# Patient Record
Sex: Male | Born: 1970
Health system: Southern US, Community
[De-identification: ages and names within clinical notes are randomized; demographics above are authoritative.]

## PROBLEM LIST (undated history)

## (undated) DIAGNOSIS — T7840XA Allergy, unspecified, initial encounter: Secondary | ICD-10-CM

## (undated) DIAGNOSIS — Z86010 Personal history of colonic polyps: Secondary | ICD-10-CM

## (undated) DIAGNOSIS — K219 Gastro-esophageal reflux disease without esophagitis: Secondary | ICD-10-CM

## (undated) DIAGNOSIS — K635 Polyp of colon: Secondary | ICD-10-CM

## (undated) HISTORY — DX: Polyp of colon: K63.5

## (undated) HISTORY — PX: UVULOPALATOPHARYNGOPLASTY, TONSILLECTOMY AND SEPTOPLASTY: SHX2632

## (undated) HISTORY — PX: ESOPHAGOGASTRODUODENOSCOPY: SHX1529

## (undated) HISTORY — PX: KNEE SURGERY: SHX244

## (undated) HISTORY — DX: Gastro-esophageal reflux disease without esophagitis: K21.9

## (undated) HISTORY — DX: Personal history of colonic polyps: Z86.010

## (undated) HISTORY — DX: Allergy, unspecified, initial encounter: T78.40XA

## (undated) HISTORY — PX: COLONOSCOPY: SHX174

## (undated) HISTORY — PX: APPENDECTOMY: SHX54

---

## 2016-06-01 ENCOUNTER — Emergency Department (HOSPITAL_COMMUNITY): Payer: Managed Care, Other (non HMO)

## 2016-06-01 ENCOUNTER — Encounter (HOSPITAL_COMMUNITY): Payer: Self-pay | Admitting: *Deleted

## 2016-06-01 ENCOUNTER — Other Ambulatory Visit: Payer: Self-pay

## 2016-06-01 ENCOUNTER — Emergency Department (HOSPITAL_COMMUNITY)
Admission: EM | Admit: 2016-06-01 | Discharge: 2016-06-01 | Disposition: A | Discharge status: Home / Self Care | Payer: Managed Care, Other (non HMO) | Attending: Emergency Medicine | Admitting: Emergency Medicine

## 2016-06-01 ENCOUNTER — Encounter: Payer: Self-pay | Admitting: Gastroenterology

## 2016-06-01 DIAGNOSIS — K209 Esophagitis, unspecified without bleeding: Secondary | ICD-10-CM

## 2016-06-01 DIAGNOSIS — R0789 Other chest pain: Secondary | ICD-10-CM | POA: Diagnosis not present

## 2016-06-01 DIAGNOSIS — R079 Chest pain, unspecified: Secondary | ICD-10-CM | POA: Diagnosis present

## 2016-06-01 LAB — BASIC METABOLIC PANEL
Anion gap: 9 (ref 5–15)
BUN: 7 mg/dL (ref 6–20)
CALCIUM: 10.1 mg/dL (ref 8.9–10.3)
CO2: 28 mmol/L (ref 22–32)
Chloride: 102 mmol/L (ref 101–111)
Creatinine, Ser: 0.91 mg/dL (ref 0.61–1.24)
GFR calc Af Amer: >60 mL/min (ref >60)
GLUCOSE: 97 mg/dL (ref 65–99)
Potassium: 3.8 mmol/L (ref 3.5–5.1)
Sodium: 139 mmol/L (ref 135–145)

## 2016-06-01 LAB — I-STAT TROPONIN, ED
TROPONIN I, POC: 0 ng/mL (ref 0.00–0.08)
TROPONIN I, POC: 0 ng/mL (ref 0.00–0.08)

## 2016-06-01 LAB — CBC
HEMATOCRIT: 48.3 % (ref 39.0–52.0)
HEMOGLOBIN: 16.8 g/dL (ref 13.0–17.0)
MCH: 30.7 pg (ref 26.0–34.0)
MCHC: 34.8 g/dL (ref 30.0–36.0)
MCV: 88.1 fL (ref 78.0–100.0)
Platelets: 283 10*3/uL (ref 150–400)
RBC: 5.48 MIL/uL (ref 4.22–5.81)
RDW: 12.4 % (ref 11.5–15.5)
WBC: 11.5 10*3/uL — ABNORMAL HIGH (ref 4.0–10.5)

## 2016-06-01 LAB — HEPATIC FUNCTION PANEL
ALBUMIN: 5.1 g/dL — AB (ref 3.5–5.0)
ALT: 26 U/L (ref 17–63)
AST: 30 U/L (ref 15–41)
Alkaline Phosphatase: 72 U/L (ref 38–126)
Bilirubin, Direct: 0.3 mg/dL (ref 0.1–0.5)
Indirect Bilirubin: 1 mg/dL — ABNORMAL HIGH (ref 0.3–0.9)
Total Bilirubin: 1.3 mg/dL — ABNORMAL HIGH (ref 0.3–1.2)
Total Protein: 8.2 g/dL — ABNORMAL HIGH (ref 6.5–8.1)

## 2016-06-01 LAB — LIPASE, BLOOD: Lipase: 21 U/L (ref 11–51)

## 2016-06-01 MED ORDER — IOPAMIDOL (ISOVUE-370) INJECTION 76%
INTRAVENOUS | Status: AC
  Administered 2016-06-01: 100 mL via INTRAVENOUS
  Filled 2016-06-01: qty 100

## 2016-06-01 MED ORDER — SODIUM CHLORIDE 0.9 % IV BOLUS (SEPSIS)
1000.0000 mL | Freq: Once | INTRAVENOUS | Status: AC
  Administered 2016-06-01: 1000 mL via INTRAVENOUS

## 2016-06-01 MED ORDER — PANTOPRAZOLE SODIUM 40 MG PO TBEC: 40.0000 mg | DELAYED_RELEASE_TABLET | Freq: Every day | ORAL | Status: DC

## 2016-06-01 MED ORDER — MORPHINE SULFATE (PF) 4 MG/ML IV SOLN
4.0000 mg | Freq: Once | INTRAVENOUS | Status: AC
  Administered 2016-06-01: 4 mg via INTRAVENOUS
  Filled 2016-06-01: qty 1

## 2016-06-01 MED ORDER — GI COCKTAIL ~~LOC~~
30.0000 mL | Freq: Once | ORAL | Status: AC
  Administered 2016-06-01: 30 mL via ORAL
  Filled 2016-06-01: qty 30

## 2016-06-01 MED ORDER — SUCRALFATE 1 GM/10ML PO SUSP
1.0000 g | Freq: Three times a day (TID) | ORAL | Status: DC | PRN
Start: 2016-06-01 — End: 2016-06-14

## 2016-06-01 MED ORDER — OXYCODONE-ACETAMINOPHEN 5-325 MG PO TABS: 1.0000 | ORAL_TABLET | Freq: Four times a day (QID) | ORAL | Status: DC | PRN

## 2016-06-01 MED ORDER — ASPIRIN 81 MG PO CHEW
324.0000 mg | CHEWABLE_TABLET | Freq: Once | ORAL | Status: AC
  Administered 2016-06-01: 324 mg via ORAL
  Filled 2016-06-01: qty 4

## 2016-06-01 MED ORDER — ONDANSETRON HCL 4 MG/2ML IJ SOLN
4.0000 mg | Freq: Once | INTRAMUSCULAR | Status: DC
  Filled 2016-06-01: qty 2

## 2016-06-01 MED ORDER — NITROGLYCERIN 0.4 MG SL SUBL: 0.4000 mg | SUBLINGUAL_TABLET | SUBLINGUAL | Status: DC | PRN

## 2016-06-01 MED ORDER — OXYCODONE-ACETAMINOPHEN 5-325 MG PO TABS
2.0000 | ORAL_TABLET | Freq: Once | ORAL | Status: AC
  Administered 2016-06-01: 2 via ORAL
  Filled 2016-06-01: qty 2

## 2016-06-01 MED ORDER — PANTOPRAZOLE SODIUM 40 MG PO TBEC
40.0000 mg | DELAYED_RELEASE_TABLET | Freq: Every day | ORAL | Status: DC
  Administered 2016-06-01: 40 mg via ORAL
  Filled 2016-06-01: qty 1

## 2016-06-01 NOTE — ED Notes (Signed)
Pt reports onset this am of pain in mid chest and into his neck and shoulder. Pain increases when breathing. Denies recent cough. ekg done at triage.

## 2016-06-01 NOTE — Discharge Instructions (Signed)
Call and make an appointment to follow-up with the gastroenterologist. Return immediately for worsening pain or any concern. You may take over-the-counter Mylanta or Maalox. Avoid NSAIDs including ibuprofen and naproxen. Avoid spicy and acidic foods.  Esophagitis Esophagitis is inflammation of the esophagus. The esophagus is the tube that carries food and liquids from your mouth to your stomach. Esophagitis can cause soreness or pain in the esophagus. This condition can make it difficult and painful to swallow.  CAUSES Most causes of esophagitis are not serious. Common causes of this condition include:  Gastroesophageal reflux disease (GERD). This is when stomach contents move back up into the esophagus (reflux).  Repeated vomiting.  An allergic-type reaction, especially caused by food allergies (eosinophilic esophagitis).  Injury to the esophagus by swallowing large pills with or without water, or swallowing certain types of medicines.  Swallowing (ingesting) harmful chemicals, such as household cleaning products.  Heavy alcohol use.  An infection of the esophagus.This most often occurs in people who have a weakened immune system.  Radiation or chemotherapy treatment for cancer.  Certain diseases such as sarcoidosis, Crohn disease, and scleroderma. SYMPTOMS Symptoms of this condition include:  Difficult or painful swallowing.  Pain with swallowing acidic liquids, such as citrus juices.  Pain with burping.  Chest pain.  Difficulty breathing.  Nausea.  Vomiting.  Pain in the abdomen.  Weight loss.  Ulcers in the mouth.  Patches of white material in the mouth (candidiasis).  Fever.  Coughing up blood or vomiting blood.  Stool that is black, tarry, or bright red. DIAGNOSIS Your health care provider will take a medical history and perform a physical exam. You may also have other tests, including:  An endoscopy to examine your stomach and esophagus with a small  camera.  A test that measures the acidity level in your esophagus.  A test that measures how much pressure is on your esophagus.  A barium swallow or modified barium swallow to show the shape, size, and functioning of your esophagus.  Allergy tests. TREATMENT Treatment for this condition depends on the cause of your esophagitis. In some cases, steroids or other medicines may be given to help relieve your symptoms or to treat the underlying cause of your condition. You may have to make some lifestyle changes, such as:  Avoiding alcohol.  Quitting smoking.  Changing your diet.  Exercising.  Changing your sleep habits and your sleep environment. HOME CARE INSTRUCTIONS Take these actions to decrease your discomfort and to help avoid complications. Diet  Follow a diet as recommended by your health care provider. This may involve avoiding foods and drinks such as:  Coffee and tea (with or without caffeine).  Drinks that contain alcohol.  Energy drinks and sports drinks.  Carbonated drinks or sodas.  Chocolate and cocoa.  Peppermint and mint flavorings.  Garlic and onions.  Horseradish.  Spicy and acidic foods, including peppers, chili powder, curry powder, vinegar, hot sauces, and barbecue sauce.  Citrus fruit juices and citrus fruits, such as oranges, lemons, and limes.  Tomato-based foods, such as red sauce, chili, salsa, and pizza with red sauce.  Fried and fatty foods, such as donuts, french fries, potato chips, and high-fat dressings.  High-fat meats, such as hot dogs and fatty cuts of red and white meats, such as rib eye steak, sausage, ham, and bacon.  High-fat dairy items, such as whole milk, butter, and cream cheese.  Eat small, frequent meals instead of large meals.  Avoid drinking large amounts of liquid  with your meals.  Avoid eating meals during the 2-3 hours before bedtime.  Avoid lying down right after you eat.  Do not exercise right after you  eat.  Avoid foods and drinks that seem to make your symptoms worse. General Instructions  Pay attention to any changes in your symptoms.  Take over-the-counter and prescription medicines only as told by your health care provider. Do not take aspirin, ibuprofen, or other NSAIDs unless your health care provider told you to do so.  If you have trouble taking pills, use a pill splitter to decrease the size of the pill. This will decrease the chance of the pill getting stuck or injuring your esophagus on the way down. Also, drink water after you take a pill.  Do not use any tobacco products, including cigarettes, chewing tobacco, and e-cigarettes. If you need help quitting, ask your health care provider.  Wear loose-fitting clothing. Do not wear anything tight around your waist that causes pressure on your abdomen.  Raise (elevate) the head of your bed about 6 inches (15 cm).  Try to reduce your stress, such as with yoga or meditation. If you need help reducing stress, ask your health care provider.  If you are overweight, reduce your weight to an amount that is healthy for you. Ask your health care provider for guidance about a safe weight loss goal.  Keep all follow-up visits as told by your health care provider. This is important. SEEK MEDICAL CARE IF:  You have new symptoms.  You have unexplained weight loss.  You have difficulty swallowing, or it hurts to swallow.  You have wheezing or a persistent cough.  Your symptoms do not improve with treatment.  You have frequent heartburn for more than two weeks. SEEK IMMEDIATE MEDICAL CARE IF:  You have severe pain in your arms, neck, jaw, teeth, or back.  You feel sweaty, dizzy, or light-headed.  You have chest pain or shortness of breath.  You vomit and your vomit looks like blood or coffee grounds.  Your stool is bloody or black.  You have a fever.  You cannot swallow, drink, or eat.   This information is not intended to  replace advice given to you by your health care provider. Make sure you discuss any questions you have with your health care provider.   Document Released: 01/03/2005 Document Revised: 08/17/2015 Document Reviewed: 03/23/2015 Elsevier Interactive Patient Education [employer].

## 2016-06-01 NOTE — ED Provider Notes (Signed)
CSN: [id number]     Arrival date & time 06/01/16  1011 History   First MD Initiated Contact with Patient 06/01/16 1026     Chief Complaint  Patient presents with  . Chest Pain     (Consider location/radiation/quality/duration/timing/severity/associated sxs/prior Treatment) HPI Patient presents with chest pain upon waking this morning. Describes it as a sharp pain that radiates to his neck. Worse with deep breathing or swallowing. No recent cough. No shortness of breath. No fever or chills. Patient states he has a history of gastric esophageal reflux disease and stopped taking his Zantac because the symptoms had improved. Ate lasagna last night. No smoking history. No personal history for coronary artery disease or from embolic disease. He does have 2 brothers that have had blood clots in the past associated with surgery. No melena.  History reviewed. No pertinent past medical history. History reviewed. No pertinent past surgical history. History reviewed. No pertinent family history. Social History  Substance Use Topics  . Smoking status: Never Smoker   . Smokeless tobacco: None  . Alcohol Use: Yes    Review of Systems  Constitutional: Negative for fever and chills.  Respiratory: Negative for cough and shortness of breath.   Cardiovascular: Positive for chest pain. Negative for palpitations and leg swelling.  Gastrointestinal: Positive for abdominal pain. Negative for nausea, vomiting, diarrhea and constipation.  Musculoskeletal: Negative for myalgias, back pain, neck pain and neck stiffness.  Skin: Negative for rash and wound.  Neurological: Negative for dizziness, weakness, light-headedness, numbness and headaches.  All other systems reviewed and are negative.     Allergies  Review of patient's allergies indicates no known allergies.  Home Medications   Prior to Admission medications   Medication Sig Start Date End Date Taking? Authorizing Provider  cetirizine (ZYRTEC) 10  MG tablet Take 10 mg by mouth daily.   Yes Historical Provider, MD  fluticasone (FLONASE) 50 MCG/ACT nasal spray Place 1 spray into both nostrils daily.   Yes Historical Provider, MD  ranitidine (ZANTAC) 150 MG tablet Take 150 mg by mouth daily as needed for heartburn.   Yes Historical Provider, MD  oxyCODONE-acetaminophen (PERCOCET/ROXICET) 5-325 MG tablet Take 1-2 tablets by mouth every 6 (six) hours as needed for severe pain. 06/01/16   Julianne Rice, MD  pantoprazole (PROTONIX) 40 MG tablet Take 1 tablet (40 mg total) by mouth daily. 06/01/16   Julianne Rice, MD  sucralfate (CARAFATE) 1 GM/10ML suspension Take 10 mLs (1 g total) by mouth 3 (three) times daily with meals as needed (abdominal/chest pain). 06/01/16   Julianne Rice, MD   BP 128/89 mmHg  Pulse 92  Temp(Src) 98 F (36.7 C) (Oral)  Resp 28  SpO2 100% Physical Exam  Constitutional: He is oriented to person, place, and time. He appears well-developed and well-nourished. No distress.  HENT:  Head: Normocephalic and atraumatic.  Mouth/Throat: Oropharynx is clear and moist.  Eyes: EOM are normal. Pupils are equal, round, and reactive to light.  Neck: Normal range of motion. Neck supple. No JVD present.  Cardiovascular: Normal rate and regular rhythm.  Exam reveals no gallop and no friction rub.   No murmur heard. Pulmonary/Chest: Effort normal and breath sounds normal. No respiratory distress. He has no wheezes. He has no rales. He exhibits no tenderness.  Abdominal: Soft. Bowel sounds are normal. He exhibits no distension and no mass. There is no tenderness. There is no rebound and no guarding.  Musculoskeletal: Normal range of motion. He exhibits no edema or tenderness.  No lower extremity asymmetry or tenderness. Distal pulses are equal.  Neurological: He is alert and oriented to person, place, and time.  Moves all extremities without deficit. Sensation is fully intact.  Skin: Skin is warm and dry. No rash noted. No  erythema.  Psychiatric: He has a normal mood and affect. His behavior is normal.  Nursing note and vitals reviewed.   ED Course  Procedures (including critical care time) Labs Review Labs Reviewed  CBC - Abnormal; Notable for the following:    WBC 11.5 (*)    All other components within normal limits  HEPATIC FUNCTION PANEL - Abnormal; Notable for the following:    Total Protein 8.2 (*)    Albumin 5.1 (*)    Total Bilirubin 1.3 (*)    Indirect Bilirubin 1.0 (*)    All other components within normal limits  BASIC METABOLIC PANEL  LIPASE, BLOOD  I-STAT TROPOININ, ED  Randolm Idol, ED    Imaging Review Dg Chest 2 View  06/01/2016  CLINICAL DATA:  Mid chest pain beginning this morning. History of acid reflux disease. Initial encounter. EXAM: CHEST  2 VIEW COMPARISON:  None. FINDINGS: Minimal left basilar atelectasis is seen. The lungs are otherwise clear. No pneumothorax or pleural effusion. Heart size is normal. No focal bony abnormality. IMPRESSION: No acute disease. Electronically Signed   By: Inge Rise M.D.   On: 06/01/2016 10:51   Ct Angio Chest Pe W Or Wo Contrast  06/01/2016  CLINICAL DATA:  Pt awoke today with midline chest soreness, radiating up into pt.s throat, s.o.b. On deep inspiration; pt went to his PCP, who sent him to ED EXAM: CT ANGIOGRAPHY CHEST WITH CONTRAST TECHNIQUE: Multidetector CT imaging of the chest was performed using the standard protocol during bolus administration of intravenous contrast. Multiplanar CT image reconstructions and MIPs were obtained to evaluate the vascular anatomy. CONTRAST:  65 mL of Isovue 370 intravenous contrast COMPARISON:  Current chest radiograph FINDINGS: Angiographic study: No evidence of a pulmonary embolus. Great vessels are normal in caliber. No aortic dissection or plaque. Neck base and axilla:  No mass or adenopathy. Mediastinum and hila: There is some soft tissue in the anterior mediastinum without mass effect. This  suggests small amount of residual thymus. There is some haziness in the fat adjacent to the esophagus. Esophageal wall may be mildly thickened. Consider esophagitis. No mediastinal or hilar masses or pathologically enlarged lymph nodes. The heart is normal in size and configuration. No coronary artery calcifications. Lungs and pleura: Mild dependent subsegmental atelectasis. Otherwise clear. No pleural effusion. No pneumothorax. Limited upper abdomen:  Unremarkable. Musculoskeletal: Minimal thoracic spine degenerative endplate spurring. Otherwise unremarkable. Review of the MIP images confirms the above findings. IMPRESSION: 1. No evidence of a pulmonary embolism. 2. Some hazy opacity in the fat adjacent to the esophagus with evidence of mild esophageal wall thickening. Consider esophagitis if this correlates clinically. 3. No other evidence of an acute abnormality. Electronically Signed   By: Lajean Manes M.D.   On: 06/01/2016 11:59   I have personally reviewed and evaluated these images and lab results as part of my medical decision-making.   EKG Interpretation   Date/Time:  Friday June 01 2016 10:18:07 EDT Ventricular Rate:  102 PR Interval:  150 QRS Duration: 76 QT Interval:  334 QTC Calculation: 435 R Axis:   83 Text Interpretation:  Sinus tachycardia Nonspecific T wave abnormality  Abnormal ECG Confirmed by Lita Mains  MD, Caulin Begley ([id number]) on 06/01/2016  10:26:39 AM Also  confirmed by Lita Mains  MD, Haneef Hallquist ([id number]), editor  [place]  [facility], [place]EVER[place] ([id number])  on 06/01/2016 10:46:28 AM      MDM   Final diagnoses:  Esophagitis  Atypical chest pain    EKG with nonspecific findings. Troponin 2 is normal. Chest x-ray is normal. CT NGO without PE. Does appear to have esophageal thickening concerning for esophagitis. This consistent with patient's symptoms. States his feeling better after opioid and GI cocktail. We'll start on PPI and have follow-up with a gastroenterologist. Return precautions  given.    Julianne Rice, MD 06/01/16 1538

## 2016-06-14 ENCOUNTER — Ambulatory Visit (INDEPENDENT_AMBULATORY_CARE_PROVIDER_SITE_OTHER): Payer: Managed Care, Other (non HMO) | Admitting: Gastroenterology

## 2016-06-14 ENCOUNTER — Encounter: Payer: Self-pay | Admitting: Gastroenterology

## 2016-06-14 VITALS — BP 106/56 | HR 70 | Ht 69.0 in | Wt 153.0 lb

## 2016-06-14 DIAGNOSIS — K219 Gastro-esophageal reflux disease without esophagitis: Secondary | ICD-10-CM | POA: Diagnosis not present

## 2016-06-14 DIAGNOSIS — Z800 Family history of malignant neoplasm of digestive organs: Secondary | ICD-10-CM | POA: Diagnosis not present

## 2016-06-14 DIAGNOSIS — R938 Abnormal findings on diagnostic imaging of other specified body structures: Secondary | ICD-10-CM

## 2016-06-14 DIAGNOSIS — R9389 Abnormal findings on diagnostic imaging of other specified body structures: Secondary | ICD-10-CM

## 2016-06-14 DIAGNOSIS — K209 Esophagitis, unspecified without bleeding: Secondary | ICD-10-CM | POA: Insufficient documentation

## 2016-06-14 NOTE — Patient Instructions (Addendum)
You have been scheduled for an endoscopy and colonoscopy. Please follow the written instructions given to you at your visit today. Please pick up your prep supplies at the pharmacy within the next 1-3 days. If you use inhalers (even only as needed), please bring them with you on the day of your procedure. Your physician has requested that you go to www.startemmi.com and enter the access code given to you at your visit today. This web site gives a general overview about your procedure. However, you should still follow specific instructions given to you by our office regarding your preparation for the procedure.      If you are age 61 or younger, your body mass index should be between 19-25. Your Body mass index is 22.58 kg/(m^2). If this is out of the aformentioned range listed, please consider follow up with your Primary Care Provider.

## 2016-06-14 NOTE — Progress Notes (Signed)
06/14/2016 Andrew Sanders [id number] 11/13/1971   HISTORY OF PRESENT ILLNESS:  "Andrew Sanders" is a 39 old male who is new to our office and was referred here by Dr. Lita Mains from the emergency department. He tells me that he's had issues with reflux in the past and most recently had been taking ranitidine OTC on a daily basis. Then 2 weeks ago he started feeling poorly with epigastric abdominal pain, sore throat, sensation that there was something in his throat/esophagus.  EKG at PCP's office ok.  Went to the emergency department where CT Angio of the chest showed evidence of mild esophageal wall thickening.  He was given a GI cocktail, started on pantoprazole 40 mg daily, and told to follow up with GI. He presents for office today for that follow-up. He continues on the pantoprazole 40 mg daily and is feeling somewhat better.  He also has a family history of colon cancer in a brother. He had a colonoscopy about 12-13 years ago in [facility], which was reportedly normal but has not had another one since that time. He denies any lower GI complaints.   History reviewed. No pertinent past medical history. Past Surgical History  Procedure Laterality Date  . Appendectomy    . Knee surgery    . Uvulopalatopharyngoplasty, tonsillectomy and septoplasty      reports that he has never smoked. He does not have any smokeless tobacco history on file. He reports that he drinks alcohol. He reports that he does not use illicit drugs. family history includes Breast cancer in his mother; Colon cancer (age of onset: 4) in his brother; Colon polyps in his brother. No Known Allergies    Outpatient Encounter Prescriptions as of 06/14/2016  Medication Sig  . cetirizine (ZYRTEC) 10 MG tablet Take 10 mg by mouth daily.  . fluticasone (FLONASE) 50 MCG/ACT nasal spray Place 1 spray into both nostrils daily.  . pantoprazole (PROTONIX) 40 MG tablet Take 1 tablet (40 mg total) by mouth daily.  . [DISCONTINUED]  oxyCODONE-acetaminophen (PERCOCET/ROXICET) 5-325 MG tablet Take 1-2 tablets by mouth every 6 (six) hours as needed for severe pain.  . [DISCONTINUED] ranitidine (ZANTAC) 150 MG tablet Take 150 mg by mouth daily as needed for heartburn.  . [DISCONTINUED] sucralfate (CARAFATE) 1 GM/10ML suspension Take 10 mLs (1 g total) by mouth 3 (three) times daily with meals as needed (abdominal/chest pain).   No facility-administered encounter medications on file as of 06/14/2016.     REVIEW OF SYSTEMS  : All other systems reviewed and negative except where noted in the History of Present Illness.   PHYSICAL EXAM: BP 106/56 mmHg  Pulse 70  Ht 5\' 9"  (1.753 m)  Wt 153 lb (69.4 kg)  BMI 22.58 kg/m2 General: Well developed white male in no acute distress Head: Normocephalic and atraumatic Eyes:  Sclerae anicteric, conjunctiva pink. Ears: Normal auditory acuity Lungs: Clear throughout to auscultation Heart: Regular rate and rhythm Abdomen: Soft, nontender, non-distended.  Normal bowel sounds.  Non-tender. Rectal:  Will be done at the time of colonoscopy. Musculoskeletal: Symmetrical with no gross deformities  Skin: No lesions on visible extremities Extremities: No edema  Neurological: Alert oriented x 4, grossly non-focal Psychological:  Alert and cooperative. Normal mood and affect  ASSESSMENT AND PLAN: -Epigastric abdominal pain, atypical chest pain, CT chest showing esophagitis:  Will continue pantoprazole 40 mg daily for now.  Will schedule for EGD as well. -Family history of colon cancer in a brother:  Will schedule for colonoscopy as  well.  *The risks, benefits, and alternatives were discussed with the patient and they consent to proceed.   CC:  No ref. provider found

## 2016-06-15 ENCOUNTER — Encounter: Payer: Self-pay | Admitting: Internal Medicine

## 2016-06-19 ENCOUNTER — Telehealth: Payer: Self-pay | Admitting: Internal Medicine

## 2016-06-19 MED ORDER — BISACODYL 5 MG PO TBEC: DELAYED_RELEASE_TABLET | ORAL | Status: DC

## 2016-06-19 MED ORDER — POLYETHYLENE GLYCOL 3350 17 GM/SCOOP PO POWD: ORAL | Status: DC

## 2016-06-19 MED ORDER — PANTOPRAZOLE SODIUM 40 MG PO TBEC: 40.0000 mg | DELAYED_RELEASE_TABLET | Freq: Every day | ORAL | Status: AC

## 2016-06-19 NOTE — Telephone Encounter (Signed)
Left Todd a voice mail message that prep supplies and his pantoprazole sent in as requested.

## 2016-06-25 ENCOUNTER — Ambulatory Visit (AMBULATORY_SURGERY_CENTER): Payer: Managed Care, Other (non HMO) | Admitting: Internal Medicine

## 2016-06-25 ENCOUNTER — Encounter: Payer: Self-pay | Admitting: Internal Medicine

## 2016-06-25 VITALS — BP 127/86 | HR 67 | Temp 97.7°F | Resp 17 | Ht 69.0 in | Wt 153.0 lb

## 2016-06-25 DIAGNOSIS — K209 Esophagitis, unspecified without bleeding: Secondary | ICD-10-CM

## 2016-06-25 DIAGNOSIS — Z800 Family history of malignant neoplasm of digestive organs: Secondary | ICD-10-CM | POA: Diagnosis present

## 2016-06-25 DIAGNOSIS — Z1211 Encounter for screening for malignant neoplasm of colon: Secondary | ICD-10-CM

## 2016-06-25 DIAGNOSIS — D122 Benign neoplasm of ascending colon: Secondary | ICD-10-CM | POA: Diagnosis not present

## 2016-06-25 MED ORDER — SODIUM CHLORIDE 0.9 % IV SOLN: 500.0000 mL | INTRAVENOUS | Status: DC

## 2016-06-25 NOTE — Progress Notes (Signed)
Called to room to assist during endoscopic procedure.  Patient ID and intended procedure confirmed with present staff. Received instructions for my participation in the procedure from the performing physician.  

## 2016-06-25 NOTE — Patient Instructions (Addendum)
I found and removed one colon polyp that looks benign. I will let you know pathology results and when to have another routine colonoscopy by mail. I anticipate it will be in 2020.  The upper endoscopy showed a small hiatal hernia. I do think you have GERD (gastroesophageal reflux disease) - would take the pantoprazole for 2 months total then try to stop - suggest making sure you avoid trigger foods and can take the ranitidine again. If you start having more frequent symptoms can return to see me but that could indicate that you do need medication on a daily basis long-term.  I appreciate the opportunity to care for you. Gatha Mayer, MD, [facility]  Handout given on polyps,GERD.  YOU HAD AN ENDOSCOPIC PROCEDURE TODAY AT [facility]:   Refer to the procedure report that was given to you for any specific questions about what was found during the examination.  If the procedure report does not answer your questions, please call your gastroenterologist to clarify.  If you requested that your care partner not be given the details of your procedure findings, then the procedure report has been included in a sealed envelope for you to review at your convenience later.  YOU SHOULD EXPECT: Some feelings of bloating in the abdomen. Passage of more gas than usual.  Walking can help get rid of the air that was put into your GI tract during the procedure and reduce the bloating. If you had a lower endoscopy (such as a colonoscopy or flexible sigmoidoscopy) you may notice spotting of blood in your stool or on the toilet paper. If you underwent a bowel prep for your procedure, you may not have a normal bowel movement for a few days.  Please Note:  You might notice some irritation and congestion in your nose or some drainage.  This is from the oxygen used during your procedure.  There is no need for concern and it should clear up in a day or so.  SYMPTOMS TO REPORT IMMEDIATELY:   Following  lower endoscopy (colonoscopy or flexible sigmoidoscopy):  Excessive amounts of blood in the stool  Significant tenderness or worsening of abdominal pains  Swelling of the abdomen that is new, acute  Fever of 100F or higher   Following upper endoscopy (EGD)  Vomiting of blood or coffee ground material  New chest pain or pain under the shoulder blades  Painful or persistently difficult swallowing  New shortness of breath  Fever of 100F or higher  Black, tarry-looking stools  For urgent or emergent issues, a gastroenterologist can be reached at any hour by calling [phone].   DIET: Your first meal following the procedure should be a small meal and then it is ok to progress to your normal diet. Heavy or fried foods are harder to digest and may make you feel nauseous or bloated.  Likewise, meals heavy in dairy and vegetables can increase bloating.  Drink plenty of fluids but you should avoid alcoholic beverages for 24 hours.  ACTIVITY:  You should plan to take it easy for the rest of today and you should NOT DRIVE or use heavy machinery until tomorrow (because of the sedation medicines used during the test).    FOLLOW UP: Our staff will call the number listed on your records the next business day following your procedure to check on you and address any questions or concerns that you may have regarding the information given to you following your procedure. If we  do not reach you, we will leave a message.  However, if you are feeling well and you are not experiencing any problems, there is no need to return our call.  We will assume that you have returned to your regular daily activities without incident.  If any biopsies were taken you will be contacted by phone or by letter within the next 1-3 weeks.  Please call us at [phone] if you have not heard about the biopsies in 3 weeks.    SIGNATURES/CONFIDENTIALITY: You and/or your care partner have signed paperwork which will be  entered into your electronic medical record.  These signatures attest to the fact that that the information above on your After Visit Summary has been reviewed and is understood.  Full responsibility of the confidentiality of this discharge information lies with you and/or your care-partner.

## 2016-06-25 NOTE — Op Note (Signed)
[place] Patient Name: Andrew Sanders Procedure Date: 06/25/2016 2:40 PM MRN: [id number] Endoscopist: Gatha Mayer , MD Age: 45 Referring MD:  Date of Birth: 1971-04-12 Gender: Male Account #: [id number] Procedure:                Colonoscopy Indications:              Screening in patient at increased risk: Colorectal                            cancer in brother before age 17 Medicines:                Propofol per Anesthesia, Monitored Anesthesia Care Procedure:                Pre-Anesthesia Assessment:                           - Prior to the procedure, a History and Physical                            was performed, and patient medications and                            allergies were reviewed. The patient's tolerance of                            previous anesthesia was also reviewed. The risks                            and benefits of the procedure and the sedation                            options and risks were discussed with the patient.                            All questions were answered, and informed consent                            was obtained. Prior Anticoagulants: The patient has                            taken no previous anticoagulant or antiplatelet                            agents. ASA Grade Assessment: II - A patient with                            mild systemic disease. After reviewing the risks                            and benefits, the patient was deemed in                            satisfactory condition to undergo the procedure.  After obtaining informed consent, the colonoscope                            was passed under direct vision. Throughout the                            procedure, the patient's blood pressure, pulse, and                            oxygen saturations were monitored continuously. The                            Model CF-HQ190L [id number]) scope was introduced   through the anus and advanced to the the cecum,                            identified by appendiceal orifice and ileocecal                            valve. The colonoscopy was performed without                            difficulty. The patient tolerated the procedure                            well. The quality of the bowel preparation was                            excellent. The bowel preparation used was Miralax.                            The ileocecal valve, appendiceal orifice, and                            rectum were photographed. Scope In: 3:01:09 PM Scope Out: 3:12:54 PM Total Procedure Duration: 0 hours 11 minutes 45 seconds  Findings:                 The perianal and digital rectal examinations were                            normal. Pertinent negatives include normal prostate                            (size, shape, and consistency).                           A 10 mm polyp was found in the ascending colon. The                            polyp was sessile. The polyp was removed with a                            cold snare. Resection and retrieval were complete.  Verification of patient identification for the                            specimen was done. Estimated blood loss was minimal.                           The exam was otherwise without abnormality on                            direct and retroflexion views. Complications:            No immediate complications. Estimated Blood Loss:     Estimated blood loss was minimal. Impression:               - One 10 mm polyp in the ascending colon, removed                            with a cold snare. Resected and retrieved.                           - The examination was otherwise normal on direct                            and retroflexion views.                           - family hx colon cancer brother aged 18 Recommendation:           - Patient has a contact number available for                             emergencies. The signs and symptoms of potential                            delayed complications were discussed with the                            patient. Return to normal activities tomorrow.                            Written discharge instructions were provided to the                            patient.                           - Continue present medications.                           - Repeat colonoscopy is recommended for                            surveillance. The colonoscopy date will be                            determined after pathology results from today's  exam become available for review.                           - Reflux prevention for the rest of the patient's                            life. Gatha Mayer, MD 06/25/2016 3:24:10 PM This report has been signed electronically.

## 2016-06-25 NOTE — Progress Notes (Signed)
Agree with Ms. Zehr's management.  Carl E. Gessner, MD, FACG  

## 2016-06-25 NOTE — Progress Notes (Signed)
Report to PACU, RN, vss, BBS= Clear.  

## 2016-06-25 NOTE — Op Note (Signed)
[place] Patient Name: Andrew Sanders Procedure Date: 06/25/2016 2:41 PM MRN: [id number] Endoscopist: Gatha Mayer , MD Age: 45 Referring MD:  Date of Birth: 11/11/1971 Gender: Male Account #: [id number] Procedure:                Upper GI endoscopy Indications:              Heartburn, Suspected esophagitis Medicines:                Propofol per Anesthesia, Monitored Anesthesia Care Procedure:                Pre-Anesthesia Assessment:                           - Prior to the procedure, a History and Physical                            was performed, and patient medications and                            allergies were reviewed. The patient's tolerance of                            previous anesthesia was also reviewed. The risks                            and benefits of the procedure and the sedation                            options and risks were discussed with the patient.                            All questions were answered, and informed consent                            was obtained. Prior Anticoagulants: The patient has                            taken no previous anticoagulant or antiplatelet                            agents. ASA Grade Assessment: II - A patient with                            mild systemic disease. After reviewing the risks                            and benefits, the patient was deemed in                            satisfactory condition to undergo the procedure.                           After obtaining informed consent, the endoscope was  passed under direct vision. Throughout the                            procedure, the patient's blood pressure, pulse, and                            oxygen saturations were monitored continuously. The                            Model GIF-HQ190 [id number]) scope was introduced                            through the mouth, and advanced to the second part     of duodenum. The upper GI endoscopy was                            accomplished without difficulty. The patient                            tolerated the procedure well. Scope In: Scope Out: Findings:                 The esophagus was normal.                           A small hiatal hernia was present.                           The examined duodenum was normal. Complications:            No immediate complications. Estimated Blood Loss:     Estimated blood loss: none. Recommendation:           - Patient has a contact number available for                            emergencies. The signs and symptoms of potential                            delayed complications were discussed with the                            patient. Return to normal activities tomorrow.                            Written discharge instructions were provided to the                            patient.                           - Reflux prevention for the rest of the patient's                            life.                           -  Continue present medications.                           - See the other procedure note for documentation of                            additional recommendations. Gatha Mayer, MD 06/25/2016 3:20:16 PM This report has been signed electronically.

## 2016-06-26 ENCOUNTER — Telehealth: Payer: Self-pay

## 2016-06-26 NOTE — Telephone Encounter (Signed)
  Follow up Call-  Call back number 06/25/2016  Post procedure Call Back phone  # [phone] cell  Permission to leave phone message Yes    Patient was called for follow up after his procedure on 06/25/2016. No answer at the number given for follow up phone call. A message was left on the answering machine.

## 2016-06-29 ENCOUNTER — Encounter: Payer: Self-pay | Admitting: Internal Medicine

## 2016-06-29 DIAGNOSIS — Z86010 Personal history of colonic polyps: Secondary | ICD-10-CM

## 2016-06-29 HISTORY — DX: Personal history of colonic polyps: Z86.010

## 2016-06-29 NOTE — Progress Notes (Signed)
Quick Note:  10 mm ssp Recall 2020 ______

## 2017-12-01 IMAGING — CT CT ANGIO CHEST
2 of 9 series · 18 of 46 positions shown · IV contrast (OMNI)
Comparison: Current chest radiograph

CLINICAL DATA: Pt awoke today with midline chest soreness,
radiating up into pt.s throat, s.o.b. On deep inspiration; pt went
to his PCP, who sent him to ED

EXAM:
CT ANGIOGRAPHY CHEST WITH CONTRAST
TECHNIQUE: Multidetector CT imaging of the chest was performed using the
standard protocol during bolus administration of intravenous
contrast. Multiplanar CT image reconstructions and MIPs were
obtained to evaluate the vascular anatomy.
CONTRAST:  65 mL of Isovue 370 intravenous contrast

[Series 5: thins · axial · 0.68mm/px · z∈[+1043,+1289]mm · 15 of 278 slices shown]
[im 16/278  lung]
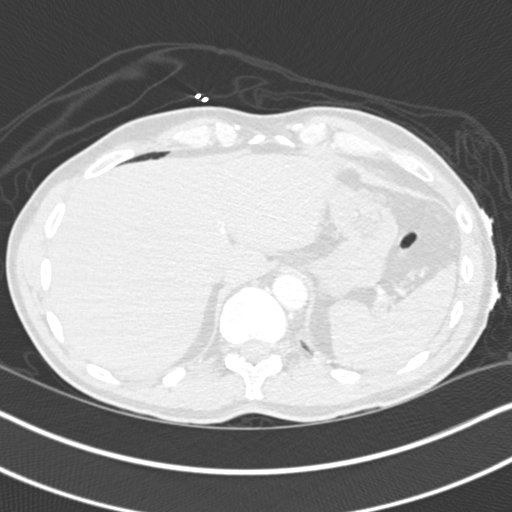
[im 31/278  soft-tissue]
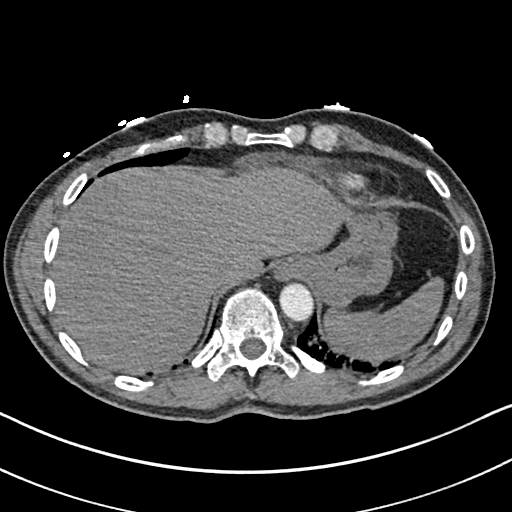
[im 47/278  lung]
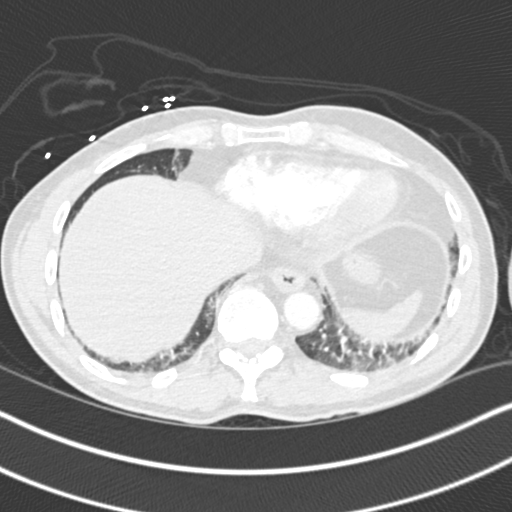
[im 62/278  soft-tissue]
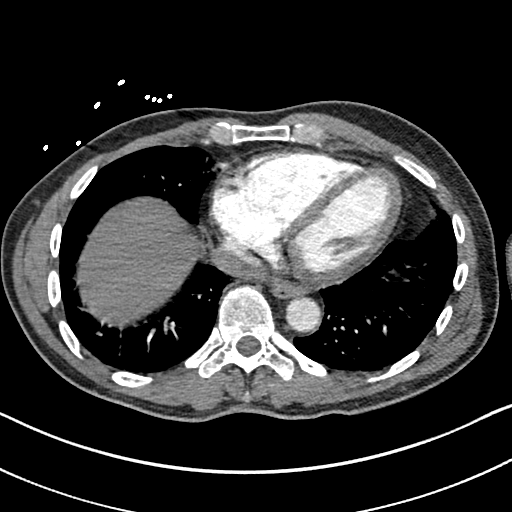
[im 93/278  lung]
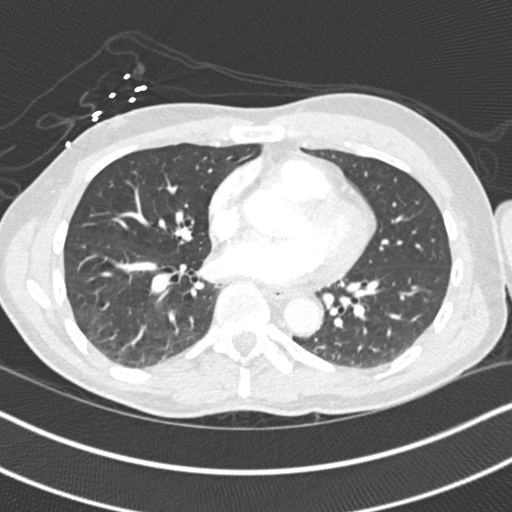
[im 108/278  soft-tissue]
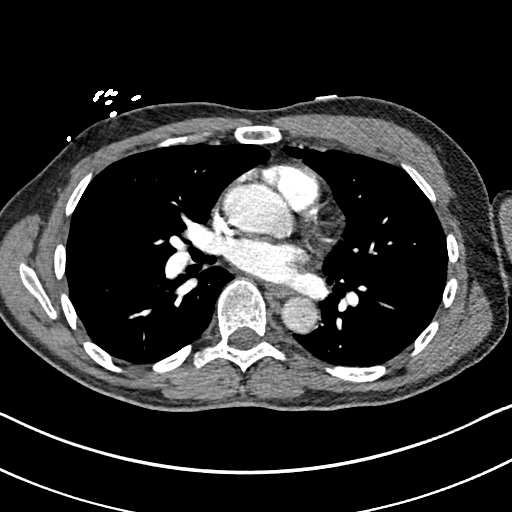
[im 124/278  lung]
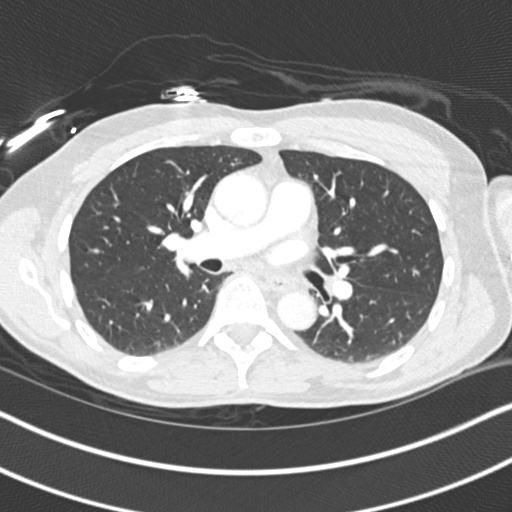
[im 139/278  soft-tissue]
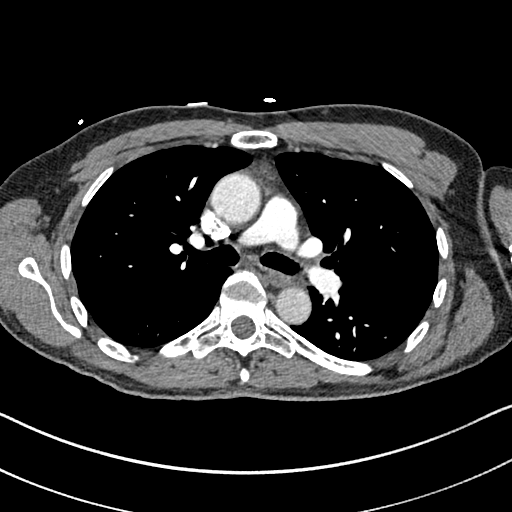
[im 154/278  lung]
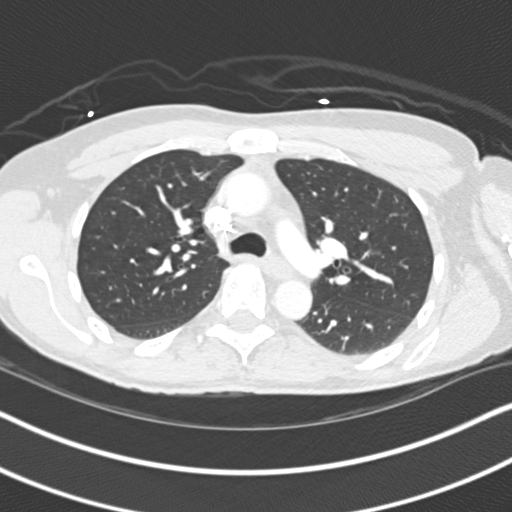
[im 170/278  soft-tissue]
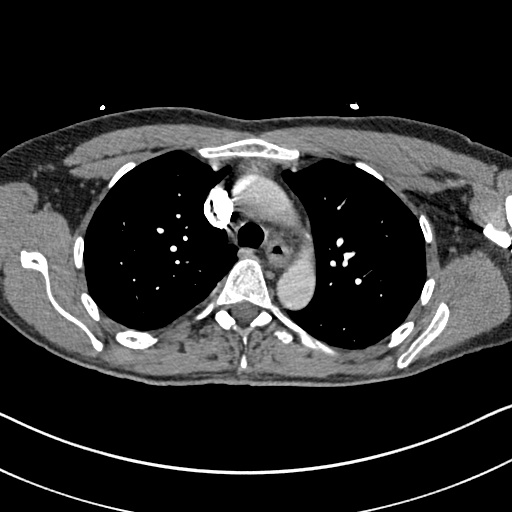
[im 185/278  lung]
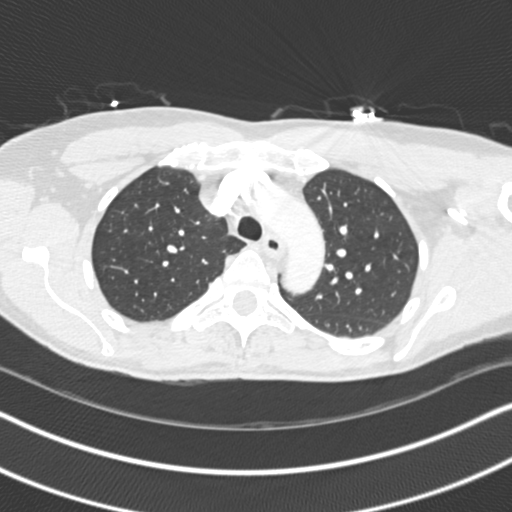
[im 216/278  soft-tissue]
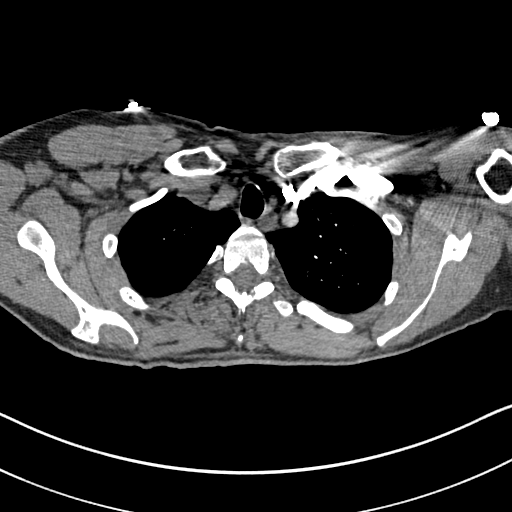
[im 231/278  lung]
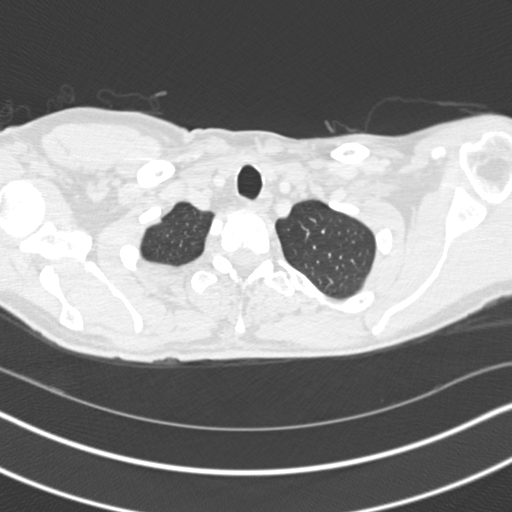
[im 247/278  soft-tissue]
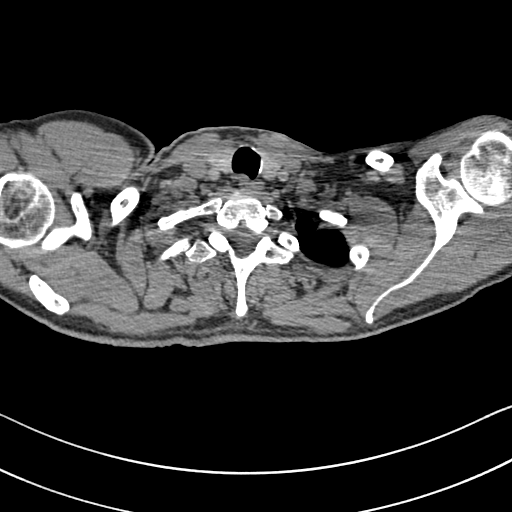
[im 262/278  lung]
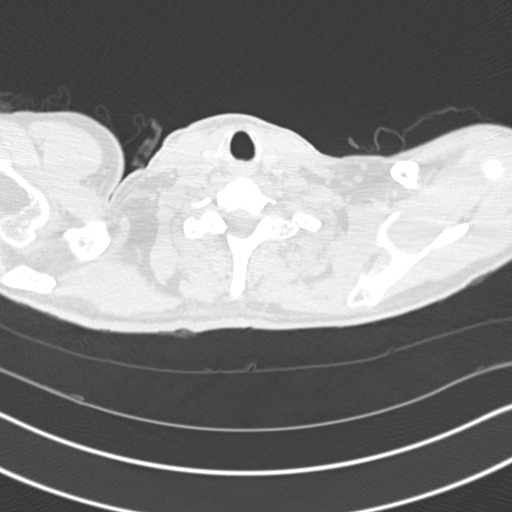

[Series 7: coronal mpr · coronal · 0.58mm/px · 3 of 144 slices shown]
[im 36/144  soft-tissue]
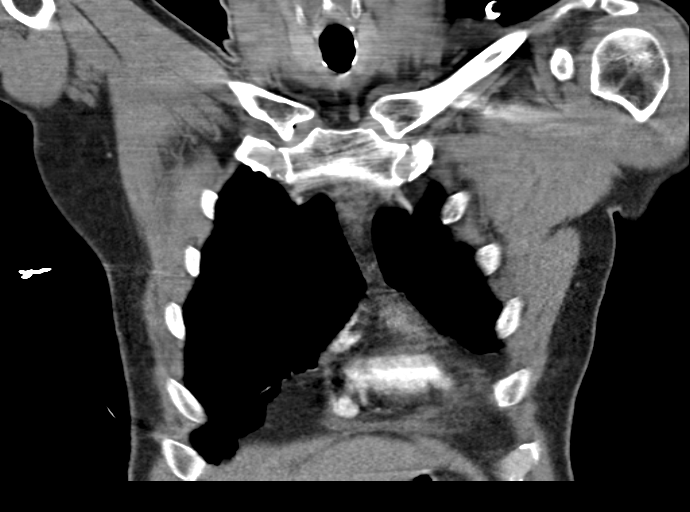
[im 72/144  soft-tissue]
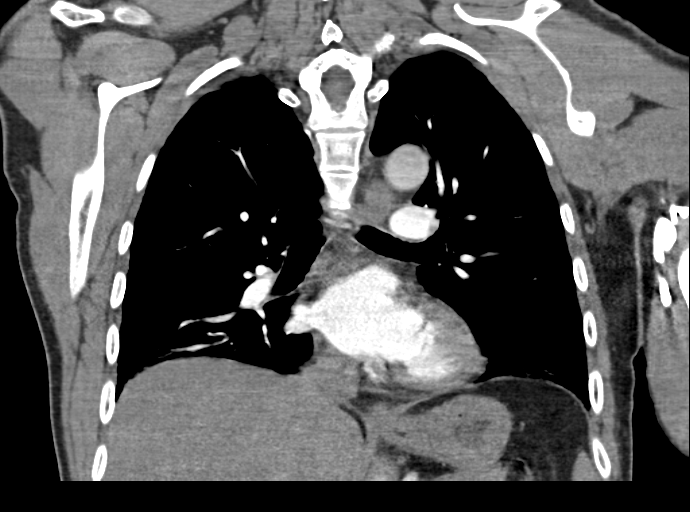
[im 108/144  soft-tissue]
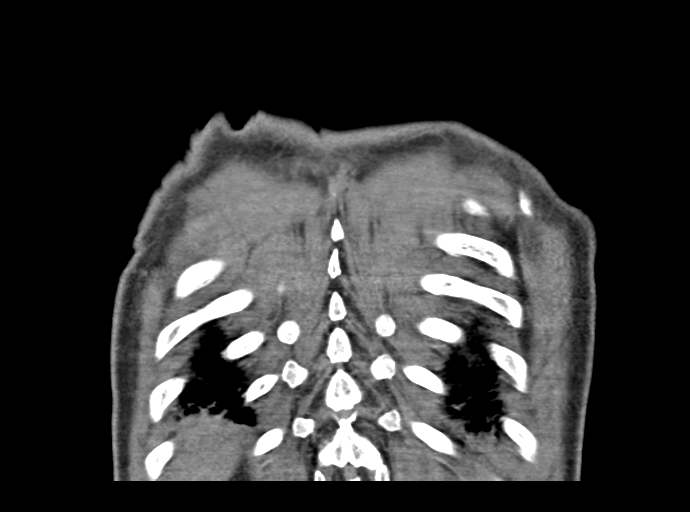

[18 of 46 positions shown; findings below may reference images not displayed]

FINDINGS: Angiographic study: No evidence of a pulmonary embolus. Great
vessels are normal in caliber. No aortic dissection or plaque.

Neck base and axilla:  No mass or adenopathy.

Mediastinum and hila: There is some soft tissue in the anterior
mediastinum without mass effect. This suggests small amount of
residual thymus. There is some haziness in the fat adjacent to the
esophagus. Esophageal wall may be mildly thickened. Consider
esophagitis. No mediastinal or hilar masses or pathologically
enlarged lymph nodes. The heart is normal in size and configuration.
No coronary artery calcifications.

Lungs and pleura: Mild dependent subsegmental atelectasis. Otherwise
clear. No pleural effusion. No pneumothorax.

Limited upper abdomen:  Unremarkable.

Musculoskeletal: Minimal thoracic spine degenerative endplate
spurring. Otherwise unremarkable.

Review of the MIP images confirms the above findings.
IMPRESSION: 1. No evidence of a pulmonary embolism.
2. Some hazy opacity in the fat adjacent to the esophagus with
evidence of mild esophageal wall thickening. Consider esophagitis if
this correlates clinically.
3. No other evidence of an acute abnormality.

## 2017-12-21 DIAGNOSIS — H5712 Ocular pain, left eye: Secondary | ICD-10-CM | POA: Diagnosis not present

## 2017-12-21 DIAGNOSIS — H5789 Other specified disorders of eye and adnexa: Secondary | ICD-10-CM | POA: Diagnosis not present

## 2017-12-21 DIAGNOSIS — H53142 Visual discomfort, left eye: Secondary | ICD-10-CM | POA: Diagnosis not present

## 2017-12-21 DIAGNOSIS — H05012 Cellulitis of left orbit: Secondary | ICD-10-CM | POA: Diagnosis not present

## 2017-12-21 DIAGNOSIS — L03213 Periorbital cellulitis: Secondary | ICD-10-CM | POA: Diagnosis not present

## 2018-07-15 ENCOUNTER — Other Ambulatory Visit: Payer: Self-pay | Admitting: Dermatology

## 2018-07-15 DIAGNOSIS — D229 Melanocytic nevi, unspecified: Secondary | ICD-10-CM | POA: Diagnosis not present

## 2018-07-15 DIAGNOSIS — D485 Neoplasm of uncertain behavior of skin: Secondary | ICD-10-CM | POA: Diagnosis not present

## 2018-07-15 DIAGNOSIS — L821 Other seborrheic keratosis: Secondary | ICD-10-CM | POA: Diagnosis not present

## 2018-09-11 DIAGNOSIS — R079 Chest pain, unspecified: Secondary | ICD-10-CM | POA: Diagnosis not present

## 2018-09-11 DIAGNOSIS — M79602 Pain in left arm: Secondary | ICD-10-CM | POA: Diagnosis not present

## 2018-09-16 DIAGNOSIS — R202 Paresthesia of skin: Secondary | ICD-10-CM | POA: Diagnosis not present

## 2018-09-16 DIAGNOSIS — M25512 Pain in left shoulder: Secondary | ICD-10-CM | POA: Diagnosis not present

## 2018-09-16 DIAGNOSIS — R0789 Other chest pain: Secondary | ICD-10-CM | POA: Diagnosis not present

## 2018-09-16 DIAGNOSIS — Z79899 Other long term (current) drug therapy: Secondary | ICD-10-CM | POA: Diagnosis not present

## 2018-09-16 DIAGNOSIS — M542 Cervicalgia: Secondary | ICD-10-CM | POA: Diagnosis not present

## 2018-09-16 DIAGNOSIS — M5412 Radiculopathy, cervical region: Secondary | ICD-10-CM | POA: Diagnosis not present

## 2018-11-13 DIAGNOSIS — R0789 Other chest pain: Secondary | ICD-10-CM | POA: Diagnosis not present

## 2018-11-13 DIAGNOSIS — M79602 Pain in left arm: Secondary | ICD-10-CM | POA: Diagnosis not present

## 2018-11-13 DIAGNOSIS — R9431 Abnormal electrocardiogram [ECG] [EKG]: Secondary | ICD-10-CM | POA: Diagnosis not present

## 2018-11-13 DIAGNOSIS — Z0189 Encounter for other specified special examinations: Secondary | ICD-10-CM | POA: Diagnosis not present

## 2018-11-17 DIAGNOSIS — R079 Chest pain, unspecified: Secondary | ICD-10-CM | POA: Diagnosis not present

## 2018-12-01 DIAGNOSIS — R0789 Other chest pain: Secondary | ICD-10-CM | POA: Diagnosis not present

## 2018-12-17 ENCOUNTER — Other Ambulatory Visit (HOSPITAL_COMMUNITY): Payer: Self-pay | Admitting: Cardiology

## 2018-12-17 DIAGNOSIS — R079 Chest pain, unspecified: Secondary | ICD-10-CM

## 2018-12-17 DIAGNOSIS — R9430 Abnormal result of cardiovascular function study, unspecified: Secondary | ICD-10-CM

## 2018-12-29 ENCOUNTER — Telehealth (HOSPITAL_COMMUNITY): Payer: Self-pay | Admitting: Emergency Medicine

## 2018-12-29 ENCOUNTER — Encounter (HOSPITAL_COMMUNITY): Payer: Self-pay | Admitting: Emergency Medicine

## 2018-12-29 ENCOUNTER — Telehealth: Payer: Self-pay | Admitting: Cardiology

## 2018-12-29 NOTE — Telephone Encounter (Signed)
Left message on voicemail with name and callback number Taraya Steward RN Navigator Cardiac Imaging [facility] [phone] Office [phone] Cell  

## 2018-12-29 NOTE — Telephone Encounter (Signed)
Reaching out to patient to offer assistance regarding upcoming cardiac imaging study; pt verbalizes understanding of appt date/time, parking situation and where to check in, pre-test NPO status and medications ordered, and verified current allergies; name and call back number provided for further questions should they arise Reise Hietala RN Navigator Cardiac Imaging [facility] [phone] office [phone] cell 

## 2018-12-30 ENCOUNTER — Encounter (HOSPITAL_COMMUNITY): Payer: Self-pay

## 2018-12-30 ENCOUNTER — Ambulatory Visit (HOSPITAL_COMMUNITY): Payer: BLUE CROSS/BLUE SHIELD

## 2019-01-05 ENCOUNTER — Telehealth (HOSPITAL_COMMUNITY): Payer: Self-pay | Admitting: Emergency Medicine

## 2019-01-05 NOTE — Telephone Encounter (Signed)
Reaching out to patient to offer assistance regarding upcoming cardiac imaging study; pt verbalizes understanding of appt date/time, parking situation and where to check in, pre-test NPO status and medications ordered, and verified current allergies; name and call back number provided for further questions should they arise Reign Dziuba RN Navigator Cardiac Imaging [facility] [phone] office [phone] cell 

## 2019-01-06 ENCOUNTER — Ambulatory Visit (HOSPITAL_COMMUNITY)
Admission: RE | Admit: 2019-01-06 | Discharge: 2019-01-06 | Disposition: A | Discharge status: Home / Self Care | Payer: BLUE CROSS/BLUE SHIELD | Source: Ambulatory Visit | Attending: Cardiology | Admitting: Cardiology

## 2019-01-06 DIAGNOSIS — R9430 Abnormal result of cardiovascular function study, unspecified: Secondary | ICD-10-CM | POA: Diagnosis not present

## 2019-01-06 DIAGNOSIS — R079 Chest pain, unspecified: Secondary | ICD-10-CM | POA: Diagnosis not present

## 2019-01-06 MED ORDER — NITROGLYCERIN 0.4 MG SL SUBL
SUBLINGUAL_TABLET | SUBLINGUAL | Status: AC
  Filled 2019-01-06: qty 1

## 2019-01-06 MED ORDER — IOPAMIDOL (ISOVUE-370) INJECTION 76%
80.0000 mL | Freq: Once | INTRAVENOUS | Status: AC | PRN
  Administered 2019-01-06: 80 mL via INTRAVENOUS

## 2019-01-06 MED ORDER — METOPROLOL TARTRATE 5 MG/5ML IV SOLN
5.0000 mg | INTRAVENOUS | Status: DC | PRN
  Filled 2019-01-06: qty 5

## 2019-01-06 MED ORDER — NITROGLYCERIN 0.4 MG SL SUBL
0.8000 mg | SUBLINGUAL_TABLET | SUBLINGUAL | Status: DC | PRN
  Administered 2019-01-06: 0.8 mg via SUBLINGUAL
  Filled 2019-01-06: qty 25

## 2019-01-06 MED ORDER — METOPROLOL TARTRATE 5 MG/5ML IV SOLN
INTRAVENOUS | Status: AC
  Filled 2019-01-06: qty 5

## 2019-01-09 DIAGNOSIS — M79602 Pain in left arm: Secondary | ICD-10-CM | POA: Diagnosis not present

## 2019-01-09 DIAGNOSIS — R9431 Abnormal electrocardiogram [ECG] [EKG]: Secondary | ICD-10-CM | POA: Diagnosis not present

## 2019-01-09 DIAGNOSIS — R9439 Abnormal result of other cardiovascular function study: Secondary | ICD-10-CM | POA: Diagnosis not present

## 2019-01-09 DIAGNOSIS — R0789 Other chest pain: Secondary | ICD-10-CM | POA: Diagnosis not present

## 2019-04-21 DIAGNOSIS — M545 Low back pain: Secondary | ICD-10-CM | POA: Diagnosis not present

## 2019-05-18 DIAGNOSIS — M545 Low back pain: Secondary | ICD-10-CM | POA: Diagnosis not present

## 2019-05-21 DIAGNOSIS — M545 Low back pain: Secondary | ICD-10-CM | POA: Diagnosis not present

## 2019-05-21 DIAGNOSIS — M25551 Pain in right hip: Secondary | ICD-10-CM | POA: Diagnosis not present

## 2019-05-26 ENCOUNTER — Other Ambulatory Visit: Payer: Self-pay | Admitting: Family Medicine

## 2019-05-26 DIAGNOSIS — M545 Low back pain, unspecified: Secondary | ICD-10-CM

## 2019-05-28 DIAGNOSIS — M545 Low back pain: Secondary | ICD-10-CM | POA: Diagnosis not present

## 2019-06-03 DIAGNOSIS — M545 Low back pain: Secondary | ICD-10-CM | POA: Diagnosis not present

## 2019-06-04 ENCOUNTER — Encounter: Payer: Self-pay | Admitting: Internal Medicine

## 2020-10-26 ENCOUNTER — Encounter: Payer: Self-pay | Admitting: Nurse Practitioner

## 2020-11-14 ENCOUNTER — Encounter: Payer: Self-pay | Admitting: Nurse Practitioner

## 2020-11-14 ENCOUNTER — Ambulatory Visit: Payer: 59 | Admitting: Nurse Practitioner

## 2020-11-14 ENCOUNTER — Other Ambulatory Visit (INDEPENDENT_AMBULATORY_CARE_PROVIDER_SITE_OTHER): Payer: 59

## 2020-11-14 VITALS — BP 122/74 | HR 86 | Ht 69.0 in | Wt 159.0 lb

## 2020-11-14 DIAGNOSIS — R1032 Left lower quadrant pain: Secondary | ICD-10-CM

## 2020-11-14 DIAGNOSIS — K921 Melena: Secondary | ICD-10-CM

## 2020-11-14 DIAGNOSIS — Z86010 Personal history of colonic polyps: Secondary | ICD-10-CM

## 2020-11-14 DIAGNOSIS — K219 Gastro-esophageal reflux disease without esophagitis: Secondary | ICD-10-CM | POA: Diagnosis not present

## 2020-11-14 DIAGNOSIS — R6881 Early satiety: Secondary | ICD-10-CM | POA: Diagnosis not present

## 2020-11-14 LAB — CBC WITH DIFFERENTIAL/PLATELET
Basophils Absolute: 0 10*3/uL (ref 0.0–0.1)
Basophils Relative: 0.5 % (ref 0.0–3.0)
Eosinophils Absolute: 0.3 10*3/uL (ref 0.0–0.7)
Eosinophils Relative: 4.8 % (ref 0.0–5.0)
HCT: 46 % (ref 39.0–52.0)
Hemoglobin: 15.7 g/dL (ref 13.0–17.0)
Lymphocytes Relative: 34.9 % (ref 12.0–46.0)
Lymphs Abs: 2 10*3/uL (ref 0.7–4.0)
MCHC: 34.2 g/dL (ref 30.0–36.0)
MCV: 90.3 fl (ref 78.0–100.0)
Monocytes Absolute: 0.5 10*3/uL (ref 0.1–1.0)
Monocytes Relative: 9.6 % (ref 3.0–12.0)
Neutro Abs: 2.8 10*3/uL (ref 1.4–7.7)
Neutrophils Relative %: 50.2 % (ref 43.0–77.0)
Platelets: 297 10*3/uL (ref 150.0–400.0)
RBC: 5.09 Mil/uL (ref 4.22–5.81)
RDW: 13.4 % (ref 11.5–15.5)
WBC: 5.6 10*3/uL (ref 4.0–10.5)

## 2020-11-14 LAB — BASIC METABOLIC PANEL
BUN: 9 mg/dL (ref 6–23)
CO2: 28 mEq/L (ref 19–32)
Calcium: 9.4 mg/dL (ref 8.4–10.5)
Chloride: 102 mEq/L (ref 96–112)
Creatinine, Ser: 0.89 mg/dL (ref 0.40–1.50)
GFR: 99.39 mL/min (ref >60.00)
Glucose, Bld: 82 mg/dL (ref 70–99)
Potassium: 4.3 mEq/L (ref 3.5–5.1)
Sodium: 140 mEq/L (ref 135–145)

## 2020-11-14 LAB — C-REACTIVE PROTEIN: CRP: <1 mg/dL (ref 0.5–20.0)

## 2020-11-14 NOTE — Addendum Note (Signed)
Addended by: Horris Latino on: 11/14/2020 09:49 AM   Modules accepted: Orders

## 2020-11-14 NOTE — Progress Notes (Signed)
11/14/2020 Andrew Sanders [id number] 10/30/1971   CHIEF COMPLAINT: Left lower quadrant abdominal pain  HISTORY OF PRESENT ILLNESS: Delvin Hedeen is a 49 year old male with a past medial history of GERD and colon polyps. Past tonsillectomy, appendectomy, 2 knee surgeries and sinus surgery.  He presents to our office today for further evaluation regarding left lower quadrant abdominal pain.  He developed lower back, LLQ and pelvic pain about 5 weeks ago.  He saw his orthopedist and he was prescribed a short course of Prednisone and exercises which reduced his back pain but he continued to have left lower quadrant abdominal pain.  He was advised by his orthopedist to follow-up in our office further GI evaluation.  He describes his left lower quadrant abdominal pain is a pressure which occurs daily and is intermittent.  No change after defecation.  No fever, sweats or chills.  No weight loss.  No history of diverticulitis.  He underwent a colonoscopy by Dr. Carlean Purl 06/25/2016 which identified a 10 mm sessile serrated polyp which was removed from the ascending colon.  He was advised to repeat a colonoscopy in 3 years which has not been done.  His brother was diagnosed with colon cancer the age of 57.  He typically passes a normal formed brown bowel movement daily.  However, a few weeks ago he passed a solid black stool.  The next 3 to to 4 days he passed a partially black and brown formed stool.  No further black stool since that time.  No Pepto-Bismol or oral iron use.  He infrequently takes Advil 200 mg 2 tabs as needed for headaches, last dose was taken as few weeks ago.  He has a history of GERD.  He takes Pantoprazole 40 mg once daily.  Approximately 6 to 9 months ago, he skipped his Pantoprazole on 2 separate occasions which resulted in severe reflux in the evening which included vomiting up bilious emesis.  No hematemesis.  The most recent episode was about 3 months ago.  As long as he takes his  Pantoprazole daily he denies having any heartburn.  No upper abdominal pain.  No dysphagia.  He complains of early satiety which is a chronic issue but seems to be more pronounced over the past few months.  He underwent an EGD 06/25/2016 which showed a small hiatal hernia otherwise was normal.  No other complaints at this time.  He received 2 Covid vaccinations.  Laboratory studies 10/19/2020: WBC 8.2.  Hemoglobin 16.3.  Hematocrit 46.3.  Platelet 338.  CRP 12.5.  BUN 11.  Creatinine 0.84.  Alk phos 81.  AST 13.  ALT 16.  Total bili 1.0.   EGD 06/25/2016: - The esophagus was normal. - A small hiatal hernia was present. - The examined duodenum was normal.  Colonoscopy 06/25/2016: - One 10 mm sessile serrated/adenoma polyp in the ascending colon, removed with a cold snare. Resected and retrieved. - The examination was otherwise normal on direct and retroflexion views. - family hx colon cancer brother aged 34 - 71 year recall   Past Medical History:  Diagnosis Date  . Allergy   . GERD (gastroesophageal reflux disease)   . Hx of sessile serrate colonic polyp 06/29/2016   Past Surgical History:  Procedure Laterality Date  . APPENDECTOMY    . COLONOSCOPY    . KNEE SURGERY    . UVULOPALATOPHARYNGOPLASTY, TONSILLECTOMY AND SEPTOPLASTY     Social History: He is married.  He has 1 son and 1  daughter.  He is employed in [employer].  Nonsmoker. He drinks 2 glasses of white wine daily. No drug use.   Family History: Brother was diagnosed with colon cancer in his early 10's. Mother age 59 with history of breast cancer. Father died age 32 COPD, abdominal aneurysm.   No Known Allergies    Outpatient Encounter Medications as of 11/14/2020  Medication Sig  . cetirizine (ZYRTEC) 10 MG tablet Take 10 mg by mouth daily.  . fluticasone (FLONASE) 50 MCG/ACT nasal spray Place 1 spray into both nostrils daily.  . pantoprazole (PROTONIX) 40 MG tablet Take 1 tablet (40 mg total) by mouth daily.   No  facility-administered encounter medications on file as of 11/14/2020.    REVIEW OF SYSTEMS: Gen: + Fatigue. Denies fever, sweats or chills. No weight loss.  CV: Denies chest pain, palpitations or edema. Resp: Denies cough, shortness of breath of hemoptysis.  GI: See HPI. GU : Denies urinary burning, blood in urine, increased urinary frequency or incontinence. MS: + Lower back pain. Derm: Denies rash, itchiness, skin lesions or unhealing ulcers. Psych: Denies depression, anxiety, memory loss, suicidal ideation and confusion. Heme: Denies bruising, bleeding. Neuro:  Denies headaches, dizziness or paresthesias. Endo:  Denies any problems with DM, thyroid or adrenal function.  PHYSICAL EXAM: BP 122/74   Pulse 86   Ht 5' 9"  (1.753 m)   Wt 159 lb (72.1 kg)   BMI 23.48 kg/m  General: Well developed 49 year old male in no acute distress. Head: Normocephalic and atraumatic. Eyes:  Sclerae non-icteric, conjunctive pink. Ears: Normal auditory acuity. Mouth: Dentition intact. No ulcers or lesions.  Neck: Supple, no lymphadenopathy or thyromegaly.  Lungs: Clear bilaterally to auscultation without wheezes, crackles or rhonchi. Heart: Regular rate and rhythm. No murmur, rub or gallop appreciated.  Abdomen: Soft, non distended.  Left lower quadrant tenderness without rebound or guarding.  No masses. No hepatosplenomegaly. Normoactive bowel sounds x 4 quadrants.  Rectal: Deferred. Musculoskeletal: Symmetrical with no gross deformities. Skin: Warm and dry. No rash or lesions on visible extremities. Extremities: No edema. Neurological: Alert oriented x 4, no focal deficits.  Psychological:  Alert and cooperative. Normal mood and affect.  ASSESSMENT AND PLAN:  68.  49 year old male with left lower quadrant abdominal pain -Repeat CBC, BUN and CRP -CTAP with oral and IV contrast -Avoid constipation.  MiraLAX nightly as needed. -Patient to call our office if his symptoms worsen -To the ED if he  develops severe abdominal pain  2.  History of colon polyps.  Family history of colon cancer (brother).  Colonoscopy 06/2016 identified a 60m sessile serrated adenoma removed from the ascending colon. -Colonoscopy benefits and risks discussed including risk with sedation, risk of bleeding, perforation and infection   3.  GERD.  Two episodes of vomiting after skip doses of pantoprazole.  Patient reported recently passing black solid stool 3 to 4 times. Early satiety.  -Continue pantoprazole 40 mg daily -Patient to call our office if black stool recurs -EGD to be done at the time of his colonoscopy. EGD benefits and risks discussed including risk with sedation, risk of bleeding, perforation and infection             CC:  Pa, [facility]*

## 2020-11-14 NOTE — Patient Instructions (Signed)
1. Start Miralax nightly as needed to avoid constipation.  2. Call office is symptoms get worse.   3. Go to ER if you develop severe abdominal pain.   Your provider has requested that you go to the basement level for lab work before leaving today. Press "B" on the elevator. The lab is located at the first door on the left as you exit the elevator.  You have been scheduled for a CT scan of the abdomen and pelvis at [facility] ([address]---this is in the same building as [employer]).   You are scheduled on Wednesday 11/16/20 at 4 pm. You should arrive 15 minutes prior to your appointment time for registration. Please follow the written instructions below on the day of your exam:  WARNING: IF YOU ARE ALLERGIC TO IODINE/X-RAY DYE, PLEASE NOTIFY RADIOLOGY IMMEDIATELY AT [phone]! YOU WILL BE GIVEN A 13 HOUR PREMEDICATION PREP.  1) Do not eat or drink anything after 12 pm (4 hours prior to your test) 2) You have been given 2 bottles of oral contrast to drink. The solution may taste better if refrigerated, but do NOT add ice or any other liquid to this solution. Shake well before drinking.    Drink 1 bottle of contrast @ 2 pm (2 hours prior to your exam)  Drink 1 bottle of contrast @ 3 pm (1 hour prior to your exam)  You may take any medications as prescribed with a small amount of water, if necessary. If you take any of the following medications: METFORMIN, GLUCOPHAGE, GLUCOVANCE, AVANDAMET, RIOMET, FORTAMET, [place] MET, JANUMET, GLUMETZA or METAGLIP, you MAY be asked to HOLD this medication 48 hours AFTER the exam.  The purpose of you drinking the oral contrast is to aid in the visualization of your intestinal tract. The contrast solution may cause some diarrhea. Depending on your individual set of symptoms, you may also receive an intravenous injection of x-ray contrast/dye. Plan on being at [facility] for 30 minutes or longer, depending on the type of  exam you are having performed.  This test typically takes 30-45 minutes to complete.  If you have any questions regarding your exam or if you need to reschedule, you may call the CT department at [phone] between the hours of 8:00 am and 5:00 pm, Monday-Friday.  ______________________________________________________________  Dennis Bast have been scheduled for an endoscopy and colonoscopy. Please follow the written instructions given to you at your visit today. Please pick up your prep supplies at the pharmacy within the next 1-3 days. If you use inhalers (even only as needed), please bring them with you on the day of your procedure.  Due to recent changes in healthcare laws, you may see the results of your imaging and laboratory studies on MyChart before your provider has had a chance to review them.  We understand that in some cases there may be results that are confusing or concerning to you. Not all laboratory results come back in the same time frame and the provider may be waiting for multiple results in order to interpret others.  Please give [place] 48 hours in order for your provider to thoroughly review all the results before contacting the office for clarification of your results.

## 2020-11-16 ENCOUNTER — Other Ambulatory Visit: Payer: Self-pay

## 2020-11-16 ENCOUNTER — Ambulatory Visit (INDEPENDENT_AMBULATORY_CARE_PROVIDER_SITE_OTHER)
Admission: RE | Admit: 2020-11-16 | Discharge: 2020-11-16 | Disposition: A | Discharge status: Home / Self Care | Payer: 59 | Source: Ambulatory Visit | Attending: Nurse Practitioner | Admitting: Nurse Practitioner

## 2020-11-16 DIAGNOSIS — R1032 Left lower quadrant pain: Secondary | ICD-10-CM | POA: Diagnosis not present

## 2020-11-16 MED ORDER — IOHEXOL 300 MG/ML  SOLN
100.0000 mL | Freq: Once | INTRAMUSCULAR | Status: AC | PRN
  Administered 2020-11-16: 100 mL via INTRAVENOUS

## 2020-11-22 ENCOUNTER — Telehealth: Payer: Self-pay | Admitting: Nurse Practitioner

## 2020-11-22 NOTE — Telephone Encounter (Signed)
Please see the imaging results for further details.

## 2020-11-22 NOTE — Telephone Encounter (Signed)
See imaging report for details.

## 2021-01-03 ENCOUNTER — Encounter: Payer: Self-pay | Admitting: Internal Medicine

## 2021-01-03 ENCOUNTER — Other Ambulatory Visit: Payer: Self-pay | Admitting: Internal Medicine

## 2021-01-03 ENCOUNTER — Other Ambulatory Visit: Payer: Self-pay

## 2021-01-03 ENCOUNTER — Ambulatory Visit (AMBULATORY_SURGERY_CENTER): Payer: 59 | Admitting: Internal Medicine

## 2021-01-03 VITALS — BP 109/89 | HR 68 | Temp 98.2°F | Resp 22 | Ht 69.0 in | Wt 159.0 lb

## 2021-01-03 DIAGNOSIS — Z86010 Personal history of colonic polyps: Secondary | ICD-10-CM | POA: Diagnosis not present

## 2021-01-03 DIAGNOSIS — D124 Benign neoplasm of descending colon: Secondary | ICD-10-CM

## 2021-01-03 DIAGNOSIS — D125 Benign neoplasm of sigmoid colon: Secondary | ICD-10-CM

## 2021-01-03 DIAGNOSIS — K219 Gastro-esophageal reflux disease without esophagitis: Secondary | ICD-10-CM

## 2021-01-03 DIAGNOSIS — K921 Melena: Secondary | ICD-10-CM | POA: Diagnosis not present

## 2021-01-03 DIAGNOSIS — Z800 Family history of malignant neoplasm of digestive organs: Secondary | ICD-10-CM

## 2021-01-03 MED ORDER — SODIUM CHLORIDE 0.9 % IV SOLN: 500.0000 mL | Freq: Once | INTRAVENOUS | Status: DC

## 2021-01-03 NOTE — Patient Instructions (Addendum)
I saw the small hiatal hernia you are known to have, otherwise normal esophagus, stomach and upper itestine.  I found and removed 2 tiny polyps from the colon - they certainly look benign. I will let you know pathology results and when to have another routine colonoscopy by mail and/or My Chart.   As far as your reflux problems - I want to make you aware of a new procedure we have available. My partner Dr. Gerrit Heck is experienced with a procedure calleed transoral incisionless fundoplication, or TIF. It is an endoscopic procedure that tightens the valve between the esophagus and stomach and allows most people to stop taking acid reflux medication. I think you are a candidate foir that though suspect you would need to have an xray to measure your hiatal hernia.  If you are interested in learning more I can set up an appointment with Dr. Bryan Lemma - just let me know.  I appreciate the opportunity to care for you. Gatha Mayer, MD, [facility]  May continue present medications.   YOU HAD AN ENDOSCOPIC PROCEDURE TODAY AT [facility]:   Refer to the procedure report that was given to you for any specific questions about what was found during the examination.  If the procedure report does not answer your questions, please call your gastroenterologist to clarify.  If you requested that your care partner not be given the details of your procedure findings, then the procedure report has been included in a sealed envelope for you to review at your convenience later.  YOU SHOULD EXPECT: Some feelings of bloating in the abdomen. Passage of more gas than usual.  Walking can help get rid of the air that was put into your GI tract during the procedure and reduce the bloating. If you had a lower endoscopy (such as a colonoscopy or flexible sigmoidoscopy) you may notice spotting of blood in your stool or on the toilet paper. If you underwent a bowel prep for your procedure, you may not have a  normal bowel movement for a few days.  Please Note:  You might notice some irritation and congestion in your nose or some drainage.  This is from the oxygen used during your procedure.  There is no need for concern and it should clear up in a day or so.  SYMPTOMS TO REPORT IMMEDIATELY:   Following lower endoscopy (colonoscopy or flexible sigmoidoscopy):  Excessive amounts of blood in the stool  Significant tenderness or worsening of abdominal pains  Swelling of the abdomen that is new, acute  Fever of 100F or higher   Following upper endoscopy (EGD)  Vomiting of blood or coffee ground material  New chest pain or pain under the shoulder blades  Painful or persistently difficult swallowing  New shortness of breath  Fever of 100F or higher  Black, tarry-looking stools  For urgent or emergent issues, a gastroenterologist can be reached at any hour by calling [phone]. Do not use MyChart messaging for urgent concerns.    DIET:  We do recommend a small meal at first, but then you may proceed to your regular diet.  Drink plenty of fluids but you should avoid alcoholic beverages for 24 hours.  ACTIVITY:  You should plan to take it easy for the rest of today and you should NOT DRIVE or use heavy machinery until tomorrow (because of the sedation medicines used during the test).    FOLLOW UP: Our staff will call the number listed on your  records 48-72 hours following your procedure to check on you and address any questions or concerns that you may have regarding the information given to you following your procedure. If we do not reach you, we will leave a message.  We will attempt to reach you two times.  During this call, we will ask if you have developed any symptoms of COVID 19. If you develop any symptoms (ie: fever, flu-like symptoms, shortness of breath, cough etc.) before then, please call [phone].  If you test positive for Covid 19 in the 2 weeks post procedure, please call  and report this information to [place].    If any biopsies were taken you will be contacted by phone or by letter within the next 1-3 weeks.  Please call us at [phone] if you have not heard about the biopsies in 3 weeks.    SIGNATURES/CONFIDENTIALITY: You and/or your care partner have signed paperwork which will be entered into your electronic medical record.  These signatures attest to the fact that that the information above on your After Visit Summary has been reviewed and is understood.  Full responsibility of the confidentiality of this discharge information lies with you and/or your care-partner.

## 2021-01-03 NOTE — Progress Notes (Unsigned)
VS by CW  I have reviewed the patient's medical history in detail and updated the computerized patient record.  

## 2021-01-03 NOTE — Op Note (Signed)
[place] Patient Name: Andrew Sanders Procedure Date: 01/03/2021 10:54 AM MRN: [id number] Endoscopist: Gatha Mayer , MD Age: 50 Referring MD:  Date of Birth: 20-Oct-1971 Gender: Male Account #: [id number] Procedure:                Colonoscopy Indications:              High risk colon cancer surveillance: Personal                            history of sessile serrated colon polyp (10 mm or                            greater in size) Medicines:                Propofol per Anesthesia, Monitored Anesthesia Care Procedure:                Pre-Anesthesia Assessment:                           - Prior to the procedure, a History and Physical                            was performed, and patient medications and                            allergies were reviewed. The patient's tolerance of                            previous anesthesia was also reviewed. The risks                            and benefits of the procedure and the sedation                            options and risks were discussed with the patient.                            All questions were answered, and informed consent                            was obtained. Prior Anticoagulants: The patient has                            taken no previous anticoagulant or antiplatelet                            agents. ASA Grade Assessment: II - A patient with                            mild systemic disease. After reviewing the risks                            and benefits, the patient was deemed in  satisfactory condition to undergo the procedure.                           After obtaining informed consent, the colonoscope                            was passed under direct vision. Throughout the                            procedure, the patient's blood pressure, pulse, and                            oxygen saturations were monitored continuously. The                            Olympus CF-HQ190L  [id number]) Colonoscope was                            introduced through the anus and advanced to the the                            cecum, identified by appendiceal orifice and                            ileocecal valve. The colonoscopy was performed                            without difficulty. The patient tolerated the                            procedure well. The quality of the bowel                            preparation was excellent. The bowel preparation                            used was Miralax via split dose instruction. No                            anatomical landmarks were photographed. IMAGE                            CAPTURE NOT FUNCTIONING Scope In: 11:11:16 AM Scope Out: 11:23:48 AM Scope Withdrawal Time: 0 hours 9 minutes 6 seconds  Total Procedure Duration: 0 hours 12 minutes 32 seconds  Findings:                 The perianal and digital rectal examinations were                            normal.                           Two sessile polyps were found in the sigmoid colon  and descending colon. The polyps were diminutive in                            size. These polyps were removed with a cold snare.                            Resection and retrieval were complete. Verification                            of patient identification for the specimen was                            done. Estimated blood loss was minimal.                           The exam was otherwise without abnormality on                            direct and retroflexion views. Complications:            No immediate complications. Estimated Blood Loss:     Estimated blood loss was minimal. Impression:               - Two diminutive polyps in the sigmoid colon and in                            the descending colon, removed with a cold snare.                            Resected and retrieved.                           - The examination was otherwise normal on direct                             and retroflexion views.                           - Personal history of colonic polyps 10 mm ssp 2017                            and FHx CRCA bother at 35 Recommendation:           - Patient has a contact number available for                            emergencies. The signs and symptoms of potential                            delayed complications were discussed with the                            patient. Return to normal activities tomorrow.  Written discharge instructions were provided to the                            patient.                           - Resume previous diet.                           - Continue present medications.                           - Repeat colonoscopy is recommended. The                            colonoscopy date will be determined after pathology                            results from today's exam become available for                            review. Gatha Mayer, MD 01/03/2021 11:39:24 AM This report has been signed electronically.

## 2021-01-03 NOTE — Progress Notes (Signed)
A/ox3, pleased with MAC, report to RN 

## 2021-01-03 NOTE — Op Note (Signed)
[place] Patient Name: Andrew Sanders Procedure Date: 01/03/2021 11:33 AM MRN: [id number] Endoscopist: Gatha Mayer , MD Age: 50 Referring MD:  Date of Birth: December 15, 1970 Gender: Male Account #: [id number] Procedure:                Upper GI endoscopy Indications:              Melena - black stools Medicines:                Propofol per Anesthesia, Monitored Anesthesia Care Procedure:                Pre-Anesthesia Assessment:                           - Prior to the procedure, a History and Physical                            was performed, and patient medications and                            allergies were reviewed. The patient's tolerance of                            previous anesthesia was also reviewed. The risks                            and benefits of the procedure and the sedation                            options and risks were discussed with the patient.                            All questions were answered, and informed consent                            was obtained. Prior Anticoagulants: The patient has                            taken no previous anticoagulant or antiplatelet                            agents. ASA Grade Assessment: II - A patient with                            mild systemic disease. After reviewing the risks                            and benefits, the patient was deemed in                            satisfactory condition to undergo the procedure.                           After obtaining informed consent, the endoscope was  passed under direct vision. Throughout the                            procedure, the patient's blood pressure, pulse, and                            oxygen saturations were monitored continuously. The                            Endoscope was introduced through the mouth, and                            advanced to the second part of duodenum. The upper                            GI  endoscopy was accomplished without difficulty.                            The patient tolerated the procedure well. Findings:                 The examined esophagus was normal.                           A 1-2 cm hiatal hernia was present.                           The gastroesophageal flap valve was visualized                            endoscopically and classified as Hill Grade III                            (minimal fold, loose to endoscope, hiatal hernia                            likely).                           The entire examined stomach was normal.                           The examined duodenum was normal.                           The cardia and gastric fundus were normal on                            retroflexion. Complications:            No immediate complications. Estimated Blood Loss:     Estimated blood loss: none. Impression:               - Normal esophagus.                           - 1-2 cm hiatal hernia.                           -  Gastroesophageal flap valve classified as Hill                            Grade III (minimal fold, loose to endoscope, hiatal                            hernia likely).                           - Normal stomach.                           - Normal examined duodenum.                           - No specimens collected. Recommendation:           - Patient has a contact number available for                            emergencies. The signs and symptoms of potential                            delayed complications were discussed with the                            patient. Return to normal activities tomorrow.                            Written discharge instructions were provided to the                            patient.                           - Resume previous diet.                           - Continue present medications.                           - See the other procedure note for documentation of                             additional recommendations.                           - Will discuss TIF procedure as a long-term GERD Tx                            option Gatha Mayer, MD 01/03/2021 11:36:25 AM This report has been signed electronically.

## 2021-01-03 NOTE — Progress Notes (Unsigned)
Called to room to assist during endoscopic procedure.  Patient ID and intended procedure confirmed with present staff. Received instructions for my participation in the procedure from the performing physician.  

## 2021-01-05 ENCOUNTER — Telehealth: Payer: Self-pay | Admitting: *Deleted

## 2021-01-05 NOTE — Telephone Encounter (Signed)
  Follow up Call-  Call back number 01/03/2021  Post procedure Call Back phone  # [phone]  Permission to leave phone message Yes  Some recent data might be hidden     Patient questions:  Do you have a fever, pain , or abdominal swelling? No. Pain Score  0 *  Have you tolerated food without any problems? Yes.    Have you been able to return to your normal activities? Yes.    Do you have any questions about your discharge instructions: Diet   No. Medications  No. Follow up visit  No.  Do you have questions or concerns about your Care? No.  Actions: * If pain score is 4 or above: No action needed, pain <4.  1. Have you developed a fever since your procedure? no  2.   Have you had an respiratory symptoms (SOB or cough) since your procedure? no  3.   Have you tested positive for COVID 19 since your procedure no  4.   Have you had any family members/close contacts diagnosed with the COVID 19 since your procedure?  no   If yes to any of these questions please route to Joylene John, RN and Joella Prince, RN

## 2021-01-11 ENCOUNTER — Encounter: Payer: Self-pay | Admitting: Internal Medicine

## 2021-09-09 DIAGNOSIS — C4491 Basal cell carcinoma of skin, unspecified: Secondary | ICD-10-CM

## 2021-09-09 HISTORY — DX: Basal cell carcinoma of skin, unspecified: C44.91

## 2021-09-19 ENCOUNTER — Other Ambulatory Visit: Payer: Self-pay

## 2021-09-19 ENCOUNTER — Ambulatory Visit: Payer: 59 | Admitting: Dermatology

## 2021-09-19 DIAGNOSIS — C44319 Basal cell carcinoma of skin of other parts of face: Secondary | ICD-10-CM | POA: Diagnosis not present

## 2021-09-19 DIAGNOSIS — L570 Actinic keratosis: Secondary | ICD-10-CM | POA: Diagnosis not present

## 2021-09-19 DIAGNOSIS — Z1283 Encounter for screening for malignant neoplasm of skin: Secondary | ICD-10-CM

## 2021-09-19 DIAGNOSIS — L821 Other seborrheic keratosis: Secondary | ICD-10-CM

## 2021-09-19 DIAGNOSIS — D485 Neoplasm of uncertain behavior of skin: Secondary | ICD-10-CM

## 2021-09-19 NOTE — Patient Instructions (Signed)

## 2021-09-27 ENCOUNTER — Telehealth: Payer: Self-pay | Admitting: Dermatology

## 2021-09-27 NOTE — Telephone Encounter (Signed)
Patient calling for results. I informed him that we have results but we are waiting for provider to review them and then we will call and let him know. FYI

## 2021-09-27 NOTE — Telephone Encounter (Addendum)
Phone call to patient with his pathology results. Path to patient. Per Dr. Denna Haggard send patient to [facility] for the [facility] procedure. Patient aware. MOH's referral sent.

## 2021-10-06 ENCOUNTER — Encounter: Payer: Self-pay | Admitting: Dermatology

## 2021-10-06 NOTE — Progress Notes (Signed)
   New Patient   Subjective  Andrew Sanders is a 50 y.o. male who presents for the following: Annual Exam (Right sideburn x 4 months nonhealing no history of atypical or skin cancer ).  Growth on right sideburn, check other areas Location:  Duration:  Quality:  Associated Signs/Symptoms: Modifying Factors:  Severity:  Timing: Context:    The following portions of the chart were reviewed this encounter and updated as appropriate:  Tobacco  Allergies  Meds  Problems  Med Hx  Surg Hx  Fam Hx      Objective  Well appearing patient in no apparent distress; mood and affect are within normal limits. Waist up examination: No atypical pigmented lesions.  1 possible nonmelanoma skin cancer right cheek will be biopsied.  Right Thigh - Anterior Textured light brown 5 mm flattopped papule.   Right Malar Cheek multiple areas with small pink gritty crusts.  Right Zygomatic Area Waxy pink 9 mm lesion with central erosion and indentation, BCC       All skin waist up examined.  Left leg.   Assessment & Plan  Seborrheic keratosis Right Thigh - Anterior  Leave if stable  Solar keratosis Right Malar Cheek  Possible [place]ak at his follow up.   Neoplasm of uncertain behavior of skin Right Zygomatic Area  Skin / nail biopsy Type of biopsy: tangential   Informed consent: discussed and consent obtained   Timeout: patient name, date of birth, surgical site, and procedure verified   Anesthesia: the lesion was anesthetized in a standard fashion   Anesthetic:  1% lidocaine w/ epinephrine 1-100,000 local infiltration Instrument used: flexible razor blade   Hemostasis achieved with: ferric subsulfate   Outcome: patient tolerated procedure well   Post-procedure details: wound care instructions given    Specimen 1 - Surgical pathology Differential Diagnosis: scc vs bcc Mohs if positive Check Margins: No  We will discuss Mohs surgery if biopsy indicates this is a reasonable  choice.  Encounter for screening for malignant neoplasm of skin  Annual skin examination.  Encouraged to self examine twice annually.  Continued ultraviolet protection.

## 2021-12-27 ENCOUNTER — Ambulatory Visit: Payer: 59 | Admitting: Dermatology

## 2022-03-07 ENCOUNTER — Ambulatory Visit: Payer: 59 | Admitting: Dermatology

## 2022-03-07 ENCOUNTER — Encounter: Payer: Self-pay | Admitting: Dermatology

## 2022-03-07 DIAGNOSIS — Z85828 Personal history of other malignant neoplasm of skin: Secondary | ICD-10-CM | POA: Diagnosis not present

## 2022-03-07 DIAGNOSIS — L570 Actinic keratosis: Secondary | ICD-10-CM

## 2022-03-07 DIAGNOSIS — L111 Transient acantholytic dermatosis [Grover]: Secondary | ICD-10-CM | POA: Diagnosis not present

## 2022-03-14 ENCOUNTER — Ambulatory Visit: Payer: 59 | Admitting: Dermatology

## 2022-03-31 ENCOUNTER — Encounter: Payer: Self-pay | Admitting: Dermatology

## 2022-03-31 NOTE — Progress Notes (Signed)
? ?  Follow-Up Visit ?  ?Subjective  ?Andrew Sanders is a 51 y.o. male who presents for the following: Follow-up (Right sideburn mohs complete & healed well, new lesion left chest pink and left cheek lesion comes and goes flakes Dt Andrew Sanders seen it at the October visit ). ? ?Check++ several other areas ?Location:  ?Duration:  ?Quality:  ?Associated Signs/Symptoms: ?Modifying Factors:  ?Severity:  ?Timing: ?Context:  ? ?Objective  ?Well appearing patient in no apparent distress; mood and affect are within normal limits. ?Mid Back ?Multiple 2 mm slightly inflamed pink papules, itch not bothersome ? ?Right Zygomatic Area ?No sign residual skin cancer, excellent cosmetic result ? ?Left Buccal Cheek ?Subtle 3 mm gritty pink crust. ? ? ? ?All skin waist up examined. ? ? ?Assessment & Plan  ? ? 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 ?Ok per Dr Denna Haggard to give a RX for the itch in the future if needed ? ?History of basal cell carcinoma (BCC) ?Right Zygomatic Area ? ?Recheck as needed change ? ?AK (actinic keratosis) ?Left Buccal Cheek ? ?Destruction of lesion - Left Buccal Cheek ?Complexity: simple   ?Destruction method: cryotherapy   ?Informed consent: discussed and consent obtained   ?Timeout:  patient name, date of birth, surgical site, and procedure verified ?Lesion destroyed using liquid nitrogen: Yes   ?Cryotherapy cycles:  3 ?Outcome: patient tolerated procedure well with no complications   ?Post-procedure details: wound care instructions given   ? ? ? ? ? ?I, Andrew Monarch, MD, have reviewed all documentation for this visit.  The documentation on 03/31/22 for the exam, diagnosis, procedures, and orders are all accurate and complete. ?

## 2023-03-13 ENCOUNTER — Ambulatory Visit: Payer: 59 | Admitting: Dermatology
